# Patient Record
Sex: Male | Born: 1980
Health system: Southern US, Community
[De-identification: ages and names within clinical notes are randomized; demographics above are authoritative.]

## PROBLEM LIST (undated history)

## (undated) DIAGNOSIS — F32A Depression, unspecified: Secondary | ICD-10-CM

## (undated) HISTORY — DX: Depression, unspecified: F32.A

---
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
## 2012-11-16 HISTORY — PX: EYE SURGERY: SHX253

## 2019-10-28 DIAGNOSIS — M545 Low back pain: Secondary | ICD-10-CM | POA: Diagnosis not present

## 2019-10-28 DIAGNOSIS — M9903 Segmental and somatic dysfunction of lumbar region: Secondary | ICD-10-CM | POA: Diagnosis not present

## 2019-10-28 DIAGNOSIS — M9905 Segmental and somatic dysfunction of pelvic region: Secondary | ICD-10-CM | POA: Diagnosis not present

## 2019-10-28 DIAGNOSIS — M9901 Segmental and somatic dysfunction of cervical region: Secondary | ICD-10-CM | POA: Diagnosis not present

## 2019-10-28 DIAGNOSIS — M9902 Segmental and somatic dysfunction of thoracic region: Secondary | ICD-10-CM | POA: Diagnosis not present

## 2019-10-28 DIAGNOSIS — M7521 Bicipital tendinitis, right shoulder: Secondary | ICD-10-CM | POA: Diagnosis not present

## 2019-10-28 DIAGNOSIS — M542 Cervicalgia: Secondary | ICD-10-CM | POA: Diagnosis not present

## 2019-10-28 DIAGNOSIS — M7912 Myalgia of auxiliary muscles, head and neck: Secondary | ICD-10-CM | POA: Diagnosis not present

## 2019-10-28 DIAGNOSIS — M7918 Myalgia, other site: Secondary | ICD-10-CM | POA: Diagnosis not present

## 2019-12-25 DIAGNOSIS — M545 Low back pain: Secondary | ICD-10-CM | POA: Diagnosis not present

## 2019-12-25 DIAGNOSIS — M9903 Segmental and somatic dysfunction of lumbar region: Secondary | ICD-10-CM | POA: Diagnosis not present

## 2019-12-25 DIAGNOSIS — M9901 Segmental and somatic dysfunction of cervical region: Secondary | ICD-10-CM | POA: Diagnosis not present

## 2019-12-25 DIAGNOSIS — M7918 Myalgia, other site: Secondary | ICD-10-CM | POA: Diagnosis not present

## 2019-12-25 DIAGNOSIS — M9905 Segmental and somatic dysfunction of pelvic region: Secondary | ICD-10-CM | POA: Diagnosis not present

## 2019-12-25 DIAGNOSIS — M542 Cervicalgia: Secondary | ICD-10-CM | POA: Diagnosis not present

## 2019-12-25 DIAGNOSIS — M9902 Segmental and somatic dysfunction of thoracic region: Secondary | ICD-10-CM | POA: Diagnosis not present

## 2019-12-25 DIAGNOSIS — M7521 Bicipital tendinitis, right shoulder: Secondary | ICD-10-CM | POA: Diagnosis not present

## 2019-12-25 DIAGNOSIS — M7912 Myalgia of auxiliary muscles, head and neck: Secondary | ICD-10-CM | POA: Diagnosis not present

## 2020-01-18 DIAGNOSIS — N5201 Erectile dysfunction due to arterial insufficiency: Secondary | ICD-10-CM | POA: Diagnosis not present

## 2020-01-23 DIAGNOSIS — N5201 Erectile dysfunction due to arterial insufficiency: Secondary | ICD-10-CM | POA: Diagnosis not present

## 2020-02-09 DIAGNOSIS — Z01419 Encounter for gynecological examination (general) (routine) without abnormal findings: Secondary | ICD-10-CM | POA: Diagnosis not present

## 2020-02-09 DIAGNOSIS — Z124 Encounter for screening for malignant neoplasm of cervix: Secondary | ICD-10-CM | POA: Diagnosis not present

## 2020-02-09 DIAGNOSIS — D500 Iron deficiency anemia secondary to blood loss (chronic): Secondary | ICD-10-CM | POA: Diagnosis not present

## 2020-02-09 DIAGNOSIS — N920 Excessive and frequent menstruation with regular cycle: Secondary | ICD-10-CM | POA: Diagnosis not present

## 2020-03-11 ENCOUNTER — Other Ambulatory Visit: Payer: Self-pay

## 2020-03-11 ENCOUNTER — Ambulatory Visit: Payer: Self-pay

## 2020-03-11 ENCOUNTER — Other Ambulatory Visit: Payer: Self-pay | Admitting: Occupational Medicine

## 2020-03-11 DIAGNOSIS — M25512 Pain in left shoulder: Secondary | ICD-10-CM

## 2020-03-27 DIAGNOSIS — M9905 Segmental and somatic dysfunction of pelvic region: Secondary | ICD-10-CM | POA: Diagnosis not present

## 2020-03-27 DIAGNOSIS — M9901 Segmental and somatic dysfunction of cervical region: Secondary | ICD-10-CM | POA: Diagnosis not present

## 2020-03-27 DIAGNOSIS — M542 Cervicalgia: Secondary | ICD-10-CM | POA: Diagnosis not present

## 2020-03-27 DIAGNOSIS — M9903 Segmental and somatic dysfunction of lumbar region: Secondary | ICD-10-CM | POA: Diagnosis not present

## 2020-03-27 DIAGNOSIS — M7912 Myalgia of auxiliary muscles, head and neck: Secondary | ICD-10-CM | POA: Diagnosis not present

## 2020-03-27 DIAGNOSIS — M9902 Segmental and somatic dysfunction of thoracic region: Secondary | ICD-10-CM | POA: Diagnosis not present

## 2020-03-27 DIAGNOSIS — M545 Low back pain: Secondary | ICD-10-CM | POA: Diagnosis not present

## 2020-03-27 DIAGNOSIS — M7918 Myalgia, other site: Secondary | ICD-10-CM | POA: Diagnosis not present

## 2020-03-27 DIAGNOSIS — M7522 Bicipital tendinitis, left shoulder: Secondary | ICD-10-CM | POA: Diagnosis not present

## 2020-04-19 MED FILL — MELOXICAM 15 MG TABLET: 15 | 30 days supply | Qty: 30 | Fill #0

## 2020-04-22 ENCOUNTER — Other Ambulatory Visit: Payer: Self-pay | Admitting: Orthopedic Surgery

## 2020-04-22 ENCOUNTER — Other Ambulatory Visit (HOSPITAL_COMMUNITY): Payer: Self-pay | Admitting: Orthopedic Surgery

## 2020-04-24 ENCOUNTER — Other Ambulatory Visit: Payer: Self-pay | Admitting: Orthopedic Surgery

## 2020-04-24 ENCOUNTER — Other Ambulatory Visit (HOSPITAL_COMMUNITY): Payer: Self-pay | Admitting: Orthopedic Surgery

## 2020-04-24 DIAGNOSIS — S43432A Superior glenoid labrum lesion of left shoulder, initial encounter: Secondary | ICD-10-CM

## 2020-05-09 ENCOUNTER — Other Ambulatory Visit (HOSPITAL_COMMUNITY): Payer: Self-pay

## 2020-05-09 ENCOUNTER — Ambulatory Visit (HOSPITAL_COMMUNITY)
Admission: RE | Admit: 2020-05-09 | Discharge: 2020-05-09 | Disposition: A | Discharge status: Home / Self Care | Payer: PRIVATE HEALTH INSURANCE | Source: Ambulatory Visit | Attending: Orthopedic Surgery | Admitting: Orthopedic Surgery

## 2020-05-09 ENCOUNTER — Other Ambulatory Visit: Payer: Self-pay

## 2020-05-09 DIAGNOSIS — S43432A Superior glenoid labrum lesion of left shoulder, initial encounter: Secondary | ICD-10-CM

## 2020-05-09 MED ORDER — LIDOCAINE HCL 1 % IJ SOLN
INTRAMUSCULAR | Status: AC
  Administered 2020-05-09: 10 mL via INTRA_ARTICULAR
  Filled 2020-05-09: qty 20

## 2020-05-09 MED ORDER — GADOBUTROL 1 MMOL/ML IV SOLN
2.5000 mL | Freq: Once | INTRAVENOUS | Status: AC | PRN
  Administered 2020-05-09: 2.5 mL via INTRAVENOUS

## 2020-05-09 MED ORDER — SODIUM CHLORIDE (PF) 0.9 % IJ SOLN
INTRAMUSCULAR | Status: AC
  Administered 2020-05-09: 10 mL via INTRA_ARTICULAR
  Filled 2020-05-09: qty 10

## 2020-06-19 MED FILL — NAPROXEN 500 MG TABS: 500 | 30 days supply | Qty: 60 | Fill #0

## 2020-08-21 ENCOUNTER — Other Ambulatory Visit: Payer: Self-pay | Admitting: *Deleted

## 2020-08-21 ENCOUNTER — Encounter: Payer: Self-pay | Admitting: *Deleted

## 2020-08-23 ENCOUNTER — Encounter: Payer: Self-pay | Admitting: Family Medicine

## 2020-08-23 ENCOUNTER — Other Ambulatory Visit: Payer: Self-pay

## 2020-08-23 ENCOUNTER — Ambulatory Visit (INDEPENDENT_AMBULATORY_CARE_PROVIDER_SITE_OTHER): Payer: 59 | Admitting: Family Medicine

## 2020-08-23 VITALS — BP 133/85 | HR 44 | Temp 97.9°F | Ht 72.0 in | Wt 191.8 lb

## 2020-08-23 DIAGNOSIS — Z0000 Encounter for general adult medical examination without abnormal findings: Secondary | ICD-10-CM | POA: Diagnosis not present

## 2020-08-23 NOTE — Patient Instructions (Signed)
It was very nice to see you today!  Keep up the good work!  I will see you back in a year or so for your next physical.  Come back to see me sooner if needed.  Take care, Dr Jerline Pain  Please try these tips to maintain a healthy lifestyle:   Eat at least 3 REAL meals and 1-2 snacks per day.  Aim for no more than 5 hours between eating.  If you eat breakfast, please do so within one hour of getting up.    Each meal should contain half fruits/vegetables, one quarter protein, and one quarter carbs (no bigger than a computer mouse)   Cut down on sweet beverages. This includes juice, soda, and sweet tea.     Drink at least 1 glass of water with each meal and aim for at least 8 glasses per day   Exercise at least 150 minutes every week.    Preventive Care 43-79 Years Old, Male Preventive care refers to lifestyle choices and visits with your health care provider that can promote health and wellness. This includes:  A yearly physical exam. This is also called an annual well check.  Regular dental and eye exams.  Immunizations.  Screening for certain conditions.  Healthy lifestyle choices, such as eating a healthy diet, getting regular exercise, not using drugs or products that contain nicotine and tobacco, and limiting alcohol use. What can I expect for my preventive care visit? Physical exam Your health care provider will check:  Height and weight. These may be used to calculate body mass index (BMI), which is a measurement that tells if you are at a healthy weight.  Heart rate and blood pressure.  Your skin for abnormal spots. Counseling Your health care provider may ask you questions about:  Alcohol, tobacco, and drug use.  Emotional well-being.  Home and relationship well-being.  Sexual activity.  Eating habits.  Work and work Statistician. What immunizations do I need?  Influenza (flu) vaccine  This is recommended every year. Tetanus, diphtheria, and  pertussis (Tdap) vaccine  You may need a Td booster every 10 years. Varicella (chickenpox) vaccine  You may need this vaccine if you have not already been vaccinated. Human papillomavirus (HPV) vaccine  If recommended by your health care provider, you may need three doses over 6 months. Measles, mumps, and rubella (MMR) vaccine  You may need at least one dose of MMR. You may also need a second dose. Meningococcal conjugate (MenACWY) vaccine  One dose is recommended if you are 57-62 years old and a Market researcher living in a residence hall, or if you have one of several medical conditions. You may also need additional booster doses. Pneumococcal conjugate (PCV13) vaccine  You may need this if you have certain conditions and were not previously vaccinated. Pneumococcal polysaccharide (PPSV23) vaccine  You may need one or two doses if you smoke cigarettes or if you have certain conditions. Hepatitis A vaccine  You may need this if you have certain conditions or if you travel or work in places where you may be exposed to hepatitis A. Hepatitis B vaccine  You may need this if you have certain conditions or if you travel or work in places where you may be exposed to hepatitis B. Haemophilus influenzae type b (Hib) vaccine  You may need this if you have certain risk factors. You may receive vaccines as individual doses or as more than one vaccine together in one shot (combination vaccines). Talk  with your health care provider about the risks and benefits of combination vaccines. What tests do I need? Blood tests  Lipid and cholesterol levels. These may be checked every 5 years starting at age 12.  Hepatitis C test.  Hepatitis B test. Screening   Diabetes screening. This is done by checking your blood sugar (glucose) after you have not eaten for a while (fasting).  Sexually transmitted disease (STD) testing. Talk with your health care provider about your test results,  treatment options, and if necessary, the need for more tests. Follow these instructions at home: Eating and drinking   Eat a diet that includes fresh fruits and vegetables, whole grains, lean protein, and low-fat dairy products.  Take vitamin and mineral supplements as recommended by your health care provider.  Do not drink alcohol if your health care provider tells you not to drink.  If you drink alcohol: ? Limit how much you have to 0-2 drinks a day. ? Be aware of how much alcohol is in your drink. In the [place] one drink equals one 12 oz bottle of beer (355 mL), one 5 oz glass of wine (148 mL), or one 1 oz glass of hard liquor (44 mL). Lifestyle  Take daily care of your teeth and gums.  Stay active. Exercise for at least 30 minutes on 5 or more days each week.  Do not use any products that contain nicotine or tobacco, such as cigarettes, e-cigarettes, and chewing tobacco. If you need help quitting, ask your health care provider.  If you are sexually active, practice safe sex. Use a condom or other form of protection to prevent STIs (sexually transmitted infections). What's next?  Go to your health care provider once a year for a well check visit.  Ask your health care provider how often you should have your eyes and teeth checked.  Stay up to date on all vaccines. This information is not intended to replace advice given to you by your health care provider. Make sure you discuss any questions you have with your health care provider. Document Revised: 10/27/2018 Document Reviewed: 10/27/2018 Elsevier Patient Education  2020 [employer].

## 2020-08-23 NOTE — Progress Notes (Signed)
Chief Complaint:  Corleone Biegler is a 39 y.o. male who presents today for his annual comprehensive physical exam.  HE is a new patient.   Assessment/Plan:  Healthy 39 year old man doing well.  Preventative Healthcare: UTD on vaccines.   Patient Counseling(The following topics were reviewed and/or handout was given):  -Nutrition: Stressed importance of moderation in sodium/caffeine intake, saturated fat and cholesterol, caloric balance, sufficient intake of fresh fruits, vegetables, and fiber.  -Stressed the importance of regular exercise.   -Substance Abuse: Discussed cessation/primary prevention of tobacco, alcohol, or other drug use; driving or other dangerous activities under the influence; availability of treatment for abuse.   -Injury prevention: Discussed safety belts, safety helmets, smoke detector, smoking near bedding or upholstery.   -Sexuality: Discussed sexually transmitted diseases, partner selection, use of condoms, avoidance of unintended pregnancy and contraceptive alternatives.   -Dental health: Discussed importance of regular tooth brushing, flossing, and dental visits.  -Health maintenance and immunizations reviewed. Please refer to Health maintenance section.  Return to care in 1 year for next preventative visit.     Subjective:  HPI:  He has no acute complaints today.   Lifestyle Diet: Balanced. Plenty of fruits and vegetables.  Exercise: Limited due to current orthopedics issues.   No flowsheet data found.  Health Maintenance Due  Topic Date Due  . Hepatitis C Screening  Never done  . HIV Screening  Never done  . TETANUS/TDAP  Never done    ROS: A complete review of systems was negative.   PMH:  The following were reviewed and entered/updated in epic: Past Medical History:  Diagnosis Date  . Depression    There are no problems to display for this patient.  Past Surgical History:  Procedure Laterality Date  . EYE SURGERY Bilateral 2014  .  TONSILLECTOMY  1999    Family History  Problem Relation Age of Onset  . Arthritis Mother   . High blood pressure Mother   . Depression Father   . Hearing loss Father   . Arthritis Maternal Grandmother   . Cancer Maternal Grandfather   . Hearing loss Paternal Grandmother   . Stroke Paternal Grandmother   . Depression Paternal Grandfather     Medications- reviewed and updated No current outpatient medications on file.   No current facility-administered medications for this visit.    Allergies-reviewed and updated No Known Allergies  Social History   Socioeconomic History  . Marital status: Married    Spouse name: Not on file  . Number of children: Not on file  . Years of education: Not on file  . Highest education level: Not on file  Occupational History  . Not on file  Tobacco Use  . Smoking status: Former Smoker  Substance and Sexual Activity  . Alcohol use: Yes  . Drug use: Not Currently  . Sexual activity: Yes    Partners: Female  Other Topics Concern  . Not on file  Social History Narrative  . Not on file   Social Determinants of Health   Financial Resource Strain:   . Difficulty of Paying Living Expenses: Not on file  Food Insecurity:   . Worried About Programme researcher, broadcasting/film/video in the Last Year: Not on file  . Ran Out of Food in the Last Year: Not on file  Transportation Needs:   . Lack of Transportation (Medical): Not on file  . Lack of Transportation (Non-Medical): Not on file  Physical Activity:   . Days of  Exercise per Week: Not on file  . Minutes of Exercise per Session: Not on file  Stress:   . Feeling of Stress : Not on file  Social Connections:   . Frequency of Communication with Friends and Family: Not on file  . Frequency of Social Gatherings with Friends and Family: Not on file  . Attends Religious Services: Not on file  . Active Member of Clubs or Organizations: Not on file  . Attends Banker Meetings: Not on file  . Marital  Status: Not on file        Objective:  Physical Exam: BP 133/85   Pulse (!) 44   Temp 97.9 F (36.6 C) (Temporal)   Ht 6' (1.829 m)   Wt 191 lb 12.8 oz (87 kg)   SpO2 99%   BMI 26.01 kg/m   Body mass index is 26.01 kg/m. Wt Readings from Last 3 Encounters:  08/23/20 191 lb 12.8 oz (87 kg)   Gen: NAD, resting comfortably HEENT: TMs normal bilaterally. OP clear. No thyromegaly noted.  CV: RRR with no murmurs appreciated Pulm: NWOB, CTAB with no crackles, wheezes, or rhonchi GI: Normal bowel sounds present. Soft, Nontender, Nondistended. MSK: no edema, cyanosis, or clubbing noted Skin: warm, dry Neuro: CN2-12 grossly intact. Strength 5/5 in upper and lower extremities. Reflexes symmetric and intact bilaterally.  Psych: Normal affect and thought content     Janaisa Birkland M. Jimmey Ralph, MD 08/23/2020 9:19 AM

## 2020-11-28 DIAGNOSIS — M542 Cervicalgia: Secondary | ICD-10-CM | POA: Diagnosis not present

## 2020-11-28 DIAGNOSIS — M9905 Segmental and somatic dysfunction of pelvic region: Secondary | ICD-10-CM | POA: Diagnosis not present

## 2020-11-28 DIAGNOSIS — M9902 Segmental and somatic dysfunction of thoracic region: Secondary | ICD-10-CM | POA: Diagnosis not present

## 2020-11-28 DIAGNOSIS — M7912 Myalgia of auxiliary muscles, head and neck: Secondary | ICD-10-CM | POA: Diagnosis not present

## 2020-11-28 DIAGNOSIS — M546 Pain in thoracic spine: Secondary | ICD-10-CM | POA: Diagnosis not present

## 2020-11-28 DIAGNOSIS — M7918 Myalgia, other site: Secondary | ICD-10-CM | POA: Diagnosis not present

## 2020-11-28 DIAGNOSIS — M545 Low back pain, unspecified: Secondary | ICD-10-CM | POA: Diagnosis not present

## 2020-11-28 DIAGNOSIS — M9901 Segmental and somatic dysfunction of cervical region: Secondary | ICD-10-CM | POA: Diagnosis not present

## 2020-11-28 DIAGNOSIS — M9903 Segmental and somatic dysfunction of lumbar region: Secondary | ICD-10-CM | POA: Diagnosis not present

## 2021-01-01 DIAGNOSIS — M9902 Segmental and somatic dysfunction of thoracic region: Secondary | ICD-10-CM | POA: Diagnosis not present

## 2021-01-01 DIAGNOSIS — M7918 Myalgia, other site: Secondary | ICD-10-CM | POA: Diagnosis not present

## 2021-01-01 DIAGNOSIS — M7912 Myalgia of auxiliary muscles, head and neck: Secondary | ICD-10-CM | POA: Diagnosis not present

## 2021-01-01 DIAGNOSIS — M9905 Segmental and somatic dysfunction of pelvic region: Secondary | ICD-10-CM | POA: Diagnosis not present

## 2021-01-01 DIAGNOSIS — M542 Cervicalgia: Secondary | ICD-10-CM | POA: Diagnosis not present

## 2021-01-01 DIAGNOSIS — M545 Low back pain, unspecified: Secondary | ICD-10-CM | POA: Diagnosis not present

## 2021-01-01 DIAGNOSIS — M9901 Segmental and somatic dysfunction of cervical region: Secondary | ICD-10-CM | POA: Diagnosis not present

## 2021-01-01 DIAGNOSIS — M9903 Segmental and somatic dysfunction of lumbar region: Secondary | ICD-10-CM | POA: Diagnosis not present

## 2021-01-01 DIAGNOSIS — M546 Pain in thoracic spine: Secondary | ICD-10-CM | POA: Diagnosis not present
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
## 2021-03-08 IMAGING — RF DG FLUORO GUIDE NDL PLC/BX
3 series · 3 of 3 positions shown · IV contrast (gadavist)
Comparison: none

CLINICAL DATA: MRI injection

EXAM:
LEFT SHOULDER INJECTION UNDER FLUOROSCOPY
TECHNIQUE: An appropriate skin entrance site was determined. The site was
marked, prepped with Betadine, draped in the usual sterile fashion,
and infiltrated locally with buffered Lidocaine. 22 gauge spinal
needle was advanced to the superomedial margin of the humeral head
under intermittent fluoroscopy. 1 ml of Lidocaine injected easily. A
mixture of 0.05 ml Gadavist 10 ml of Omnipaque 300 was then used to
opacify the left shoulder capsule. No immediate complication.
FLUOROSCOPY TIME:  Fluoroscopy Time:  24 seconds
Radiation Exposure Index (if provided by the fluoroscopic device): 2
mGy

[Series 1: cp_standard · 0.18mm/px · 1 of 1 slices shown (1 of 3)]
[im 1/1]
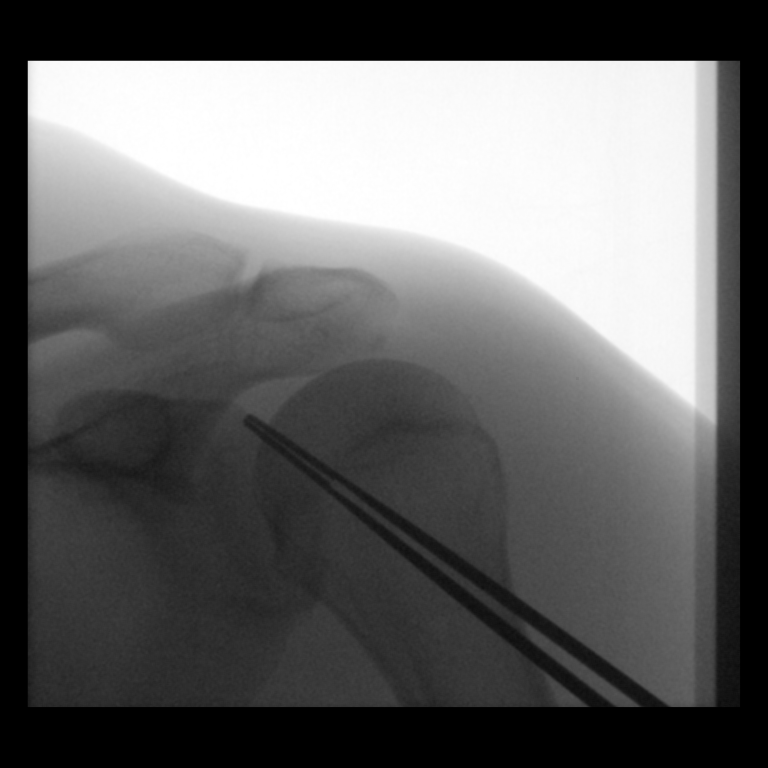

[Series 2: cp_standard · 0.18mm/px · 1 of 1 slices shown (2 of 3)]
[im 1/1]
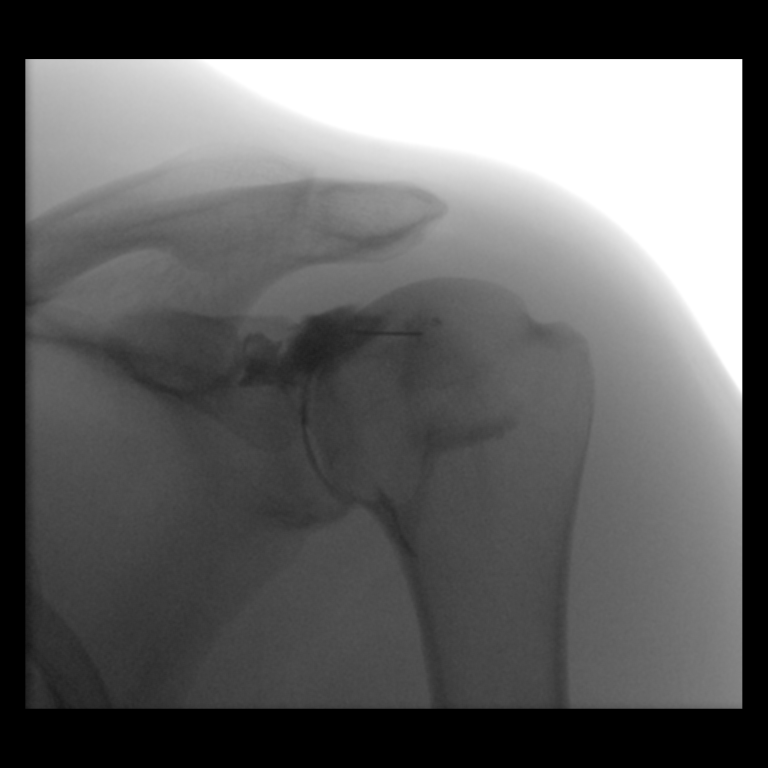

[Series 3: cp_standard · 0.18mm/px · 1 of 1 slices shown (3 of 3)]
[im 1/1]
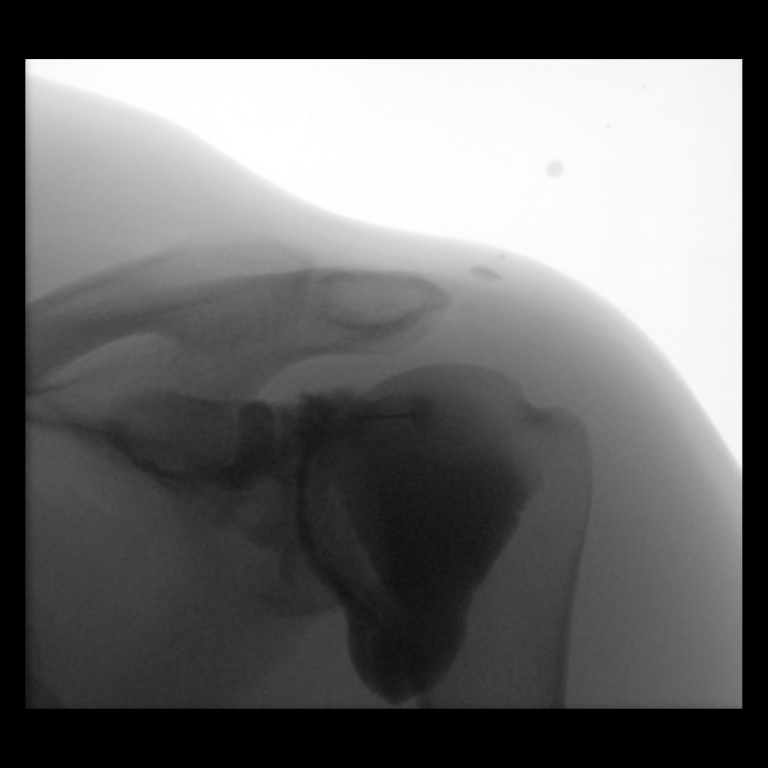

[3 of 3 positions shown; findings below may reference images not displayed]

FINDINGS: Intra-articular contrast is identified upon injection. There is
significant osseous abnormality.
IMPRESSION: Technically successful left shoulder injection for MRI.

## 2021-04-09 ENCOUNTER — Telehealth: Payer: Self-pay

## 2021-04-09 NOTE — Telephone Encounter (Signed)
CODE:[phone] // Fax number is [phone]

## 2021-04-09 NOTE — Telephone Encounter (Signed)
Jerry Wilson is need a TB lab done for school purposes, wondering if we can fax a request to [facility] on [facility].

## 2021-04-09 NOTE — Telephone Encounter (Signed)
Please advise 

## 2021-04-11 ENCOUNTER — Other Ambulatory Visit: Payer: Self-pay | Admitting: *Deleted

## 2021-04-11 DIAGNOSIS — Z111 Encounter for screening for respiratory tuberculosis: Secondary | ICD-10-CM

## 2021-04-11 NOTE — Telephone Encounter (Signed)
Ok with me. Please place any necessary orders. 

## 2021-04-11 NOTE — Telephone Encounter (Signed)
Pt is calling about this order. Pt is there now and is asking for [place] to send the order over now.

## 2021-04-11 NOTE — Telephone Encounter (Signed)
Order done and faxed over to [facility] by [facility].

## 2021-04-17 ENCOUNTER — Other Ambulatory Visit: Payer: Self-pay | Admitting: Family Medicine

## 2021-04-17 DIAGNOSIS — Z111 Encounter for screening for respiratory tuberculosis: Secondary | ICD-10-CM | POA: Diagnosis not present

## 2021-04-20 LAB — QUANTIFERON-TB GOLD PLUS
QuantiFERON Mitogen Value: >10 IU/mL
QuantiFERON Nil Value: 0.06 IU/mL
QuantiFERON TB1 Ag Value: 0.05 IU/mL
QuantiFERON TB2 Ag Value: 0.03 IU/mL
QuantiFERON-TB Gold Plus: NEGATIVE

## 2021-04-21 NOTE — Progress Notes (Signed)
Please inform patient of the following:  His TB test is negative.  Katina Degree. Jimmey Ralph, MD 04/21/2021 2:16 PM

## 2021-06-24 DIAGNOSIS — M546 Pain in thoracic spine: Secondary | ICD-10-CM | POA: Diagnosis not present

## 2021-06-24 DIAGNOSIS — M9903 Segmental and somatic dysfunction of lumbar region: Secondary | ICD-10-CM | POA: Diagnosis not present

## 2021-06-24 DIAGNOSIS — M7912 Myalgia of auxiliary muscles, head and neck: Secondary | ICD-10-CM | POA: Diagnosis not present

## 2021-06-24 DIAGNOSIS — M545 Low back pain, unspecified: Secondary | ICD-10-CM | POA: Diagnosis not present

## 2021-06-24 DIAGNOSIS — M9901 Segmental and somatic dysfunction of cervical region: Secondary | ICD-10-CM | POA: Diagnosis not present

## 2021-06-24 DIAGNOSIS — M542 Cervicalgia: Secondary | ICD-10-CM | POA: Diagnosis not present

## 2021-06-24 DIAGNOSIS — M9905 Segmental and somatic dysfunction of pelvic region: Secondary | ICD-10-CM | POA: Diagnosis not present

## 2021-06-24 DIAGNOSIS — M9902 Segmental and somatic dysfunction of thoracic region: Secondary | ICD-10-CM | POA: Diagnosis not present

## 2021-08-18 DIAGNOSIS — M542 Cervicalgia: Secondary | ICD-10-CM | POA: Diagnosis not present

## 2021-08-18 DIAGNOSIS — M546 Pain in thoracic spine: Secondary | ICD-10-CM | POA: Diagnosis not present

## 2021-08-18 DIAGNOSIS — M9902 Segmental and somatic dysfunction of thoracic region: Secondary | ICD-10-CM | POA: Diagnosis not present

## 2021-08-18 DIAGNOSIS — M545 Low back pain, unspecified: Secondary | ICD-10-CM | POA: Diagnosis not present

## 2021-08-18 DIAGNOSIS — M9905 Segmental and somatic dysfunction of pelvic region: Secondary | ICD-10-CM | POA: Diagnosis not present

## 2021-08-18 DIAGNOSIS — M9901 Segmental and somatic dysfunction of cervical region: Secondary | ICD-10-CM | POA: Diagnosis not present

## 2021-08-18 DIAGNOSIS — M9903 Segmental and somatic dysfunction of lumbar region: Secondary | ICD-10-CM | POA: Diagnosis not present

## 2021-08-18 DIAGNOSIS — M7912 Myalgia of auxiliary muscles, head and neck: Secondary | ICD-10-CM | POA: Diagnosis not present

## 2021-09-16 DIAGNOSIS — M9901 Segmental and somatic dysfunction of cervical region: Secondary | ICD-10-CM | POA: Diagnosis not present

## 2021-09-16 DIAGNOSIS — M9902 Segmental and somatic dysfunction of thoracic region: Secondary | ICD-10-CM | POA: Diagnosis not present

## 2021-09-16 DIAGNOSIS — M545 Low back pain, unspecified: Secondary | ICD-10-CM | POA: Diagnosis not present

## 2021-09-16 DIAGNOSIS — M7912 Myalgia of auxiliary muscles, head and neck: Secondary | ICD-10-CM | POA: Diagnosis not present

## 2021-09-16 DIAGNOSIS — M9905 Segmental and somatic dysfunction of pelvic region: Secondary | ICD-10-CM | POA: Diagnosis not present

## 2021-09-16 DIAGNOSIS — M546 Pain in thoracic spine: Secondary | ICD-10-CM | POA: Diagnosis not present

## 2021-09-16 DIAGNOSIS — M9903 Segmental and somatic dysfunction of lumbar region: Secondary | ICD-10-CM | POA: Diagnosis not present

## 2021-09-16 DIAGNOSIS — M542 Cervicalgia: Secondary | ICD-10-CM | POA: Diagnosis not present

## 2021-11-03 DIAGNOSIS — M9903 Segmental and somatic dysfunction of lumbar region: Secondary | ICD-10-CM | POA: Diagnosis not present

## 2021-11-03 DIAGNOSIS — M546 Pain in thoracic spine: Secondary | ICD-10-CM | POA: Diagnosis not present

## 2021-11-03 DIAGNOSIS — M9902 Segmental and somatic dysfunction of thoracic region: Secondary | ICD-10-CM | POA: Diagnosis not present

## 2021-11-03 DIAGNOSIS — M7912 Myalgia of auxiliary muscles, head and neck: Secondary | ICD-10-CM | POA: Diagnosis not present

## 2021-11-03 DIAGNOSIS — M545 Low back pain, unspecified: Secondary | ICD-10-CM | POA: Diagnosis not present

## 2021-11-03 DIAGNOSIS — M9905 Segmental and somatic dysfunction of pelvic region: Secondary | ICD-10-CM | POA: Diagnosis not present

## 2021-11-03 DIAGNOSIS — M542 Cervicalgia: Secondary | ICD-10-CM | POA: Diagnosis not present

## 2021-11-03 DIAGNOSIS — M9901 Segmental and somatic dysfunction of cervical region: Secondary | ICD-10-CM | POA: Diagnosis not present

## 2021-11-19 DIAGNOSIS — M9905 Segmental and somatic dysfunction of pelvic region: Secondary | ICD-10-CM | POA: Diagnosis not present

## 2021-11-19 DIAGNOSIS — M542 Cervicalgia: Secondary | ICD-10-CM | POA: Diagnosis not present

## 2021-11-19 DIAGNOSIS — M9902 Segmental and somatic dysfunction of thoracic region: Secondary | ICD-10-CM | POA: Diagnosis not present

## 2021-11-19 DIAGNOSIS — M9903 Segmental and somatic dysfunction of lumbar region: Secondary | ICD-10-CM | POA: Diagnosis not present

## 2021-11-19 DIAGNOSIS — M7912 Myalgia of auxiliary muscles, head and neck: Secondary | ICD-10-CM | POA: Diagnosis not present

## 2021-11-19 DIAGNOSIS — M546 Pain in thoracic spine: Secondary | ICD-10-CM | POA: Diagnosis not present

## 2021-11-19 DIAGNOSIS — M545 Low back pain, unspecified: Secondary | ICD-10-CM | POA: Diagnosis not present

## 2021-11-19 DIAGNOSIS — M9901 Segmental and somatic dysfunction of cervical region: Secondary | ICD-10-CM | POA: Diagnosis not present

## 2022-01-19 DIAGNOSIS — B309 Viral conjunctivitis, unspecified: Secondary | ICD-10-CM | POA: Diagnosis not present

## 2022-02-19 DIAGNOSIS — M545 Low back pain, unspecified: Secondary | ICD-10-CM | POA: Diagnosis not present

## 2022-02-19 DIAGNOSIS — M9903 Segmental and somatic dysfunction of lumbar region: Secondary | ICD-10-CM | POA: Diagnosis not present

## 2022-02-19 DIAGNOSIS — M25652 Stiffness of left hip, not elsewhere classified: Secondary | ICD-10-CM | POA: Diagnosis not present

## 2022-02-19 DIAGNOSIS — M9901 Segmental and somatic dysfunction of cervical region: Secondary | ICD-10-CM | POA: Diagnosis not present

## 2022-02-19 DIAGNOSIS — M7912 Myalgia of auxiliary muscles, head and neck: Secondary | ICD-10-CM | POA: Diagnosis not present

## 2022-02-19 DIAGNOSIS — M25651 Stiffness of right hip, not elsewhere classified: Secondary | ICD-10-CM | POA: Diagnosis not present

## 2022-02-19 DIAGNOSIS — M9905 Segmental and somatic dysfunction of pelvic region: Secondary | ICD-10-CM | POA: Diagnosis not present

## 2022-02-19 DIAGNOSIS — S39012A Strain of muscle, fascia and tendon of lower back, initial encounter: Secondary | ICD-10-CM | POA: Diagnosis not present

## 2022-02-19 DIAGNOSIS — M9902 Segmental and somatic dysfunction of thoracic region: Secondary | ICD-10-CM | POA: Diagnosis not present

## 2022-03-04 DIAGNOSIS — M7912 Myalgia of auxiliary muscles, head and neck: Secondary | ICD-10-CM | POA: Diagnosis not present

## 2022-03-04 DIAGNOSIS — M545 Low back pain, unspecified: Secondary | ICD-10-CM | POA: Diagnosis not present

## 2022-03-04 DIAGNOSIS — M9902 Segmental and somatic dysfunction of thoracic region: Secondary | ICD-10-CM | POA: Diagnosis not present

## 2022-03-04 DIAGNOSIS — M9901 Segmental and somatic dysfunction of cervical region: Secondary | ICD-10-CM | POA: Diagnosis not present

## 2022-03-04 DIAGNOSIS — M9905 Segmental and somatic dysfunction of pelvic region: Secondary | ICD-10-CM | POA: Diagnosis not present

## 2022-03-04 DIAGNOSIS — S39012A Strain of muscle, fascia and tendon of lower back, initial encounter: Secondary | ICD-10-CM | POA: Diagnosis not present

## 2022-03-04 DIAGNOSIS — M25652 Stiffness of left hip, not elsewhere classified: Secondary | ICD-10-CM | POA: Diagnosis not present

## 2022-03-04 DIAGNOSIS — M25651 Stiffness of right hip, not elsewhere classified: Secondary | ICD-10-CM | POA: Diagnosis not present

## 2022-03-04 DIAGNOSIS — M9903 Segmental and somatic dysfunction of lumbar region: Secondary | ICD-10-CM | POA: Diagnosis not present

## 2022-03-30 DIAGNOSIS — M25512 Pain in left shoulder: Secondary | ICD-10-CM | POA: Diagnosis not present

## 2022-04-28 ENCOUNTER — Other Ambulatory Visit (HOSPITAL_COMMUNITY): Payer: Self-pay

## 2022-04-28 DIAGNOSIS — M7542 Impingement syndrome of left shoulder: Secondary | ICD-10-CM | POA: Diagnosis not present

## 2022-04-28 DIAGNOSIS — M7552 Bursitis of left shoulder: Secondary | ICD-10-CM | POA: Diagnosis not present

## 2022-04-28 DIAGNOSIS — M7522 Bicipital tendinitis, left shoulder: Secondary | ICD-10-CM | POA: Diagnosis not present

## 2022-04-28 DIAGNOSIS — S43492A Other sprain of left shoulder joint, initial encounter: Secondary | ICD-10-CM | POA: Diagnosis not present

## 2022-04-28 DIAGNOSIS — G8918 Other acute postprocedural pain: Secondary | ICD-10-CM | POA: Diagnosis not present

## 2022-04-28 DIAGNOSIS — S43432A Superior glenoid labrum lesion of left shoulder, initial encounter: Secondary | ICD-10-CM | POA: Diagnosis not present

## 2022-04-28 DIAGNOSIS — M67814 Other specified disorders of tendon, left shoulder: Secondary | ICD-10-CM | POA: Diagnosis not present

## 2022-04-28 MED ORDER — METHOCARBAMOL 500 MG PO TABS
ORAL_TABLET | ORAL | 0 refills | Status: DC
  Filled 2022-04-28: qty 20, 7d supply, fill #0

## 2022-04-28 MED ORDER — OXYCODONE HCL 5 MG PO TABS
ORAL_TABLET | ORAL | 0 refills | Status: AC
  Filled 2022-04-28: qty 20, 5d supply, fill #0

## 2022-05-08 ENCOUNTER — Other Ambulatory Visit (HOSPITAL_COMMUNITY): Payer: Self-pay

## 2022-05-08 DIAGNOSIS — Z9889 Other specified postprocedural states: Secondary | ICD-10-CM | POA: Diagnosis not present

## 2022-05-08 MED ORDER — METHOCARBAMOL 500 MG PO TABS
ORAL_TABLET | ORAL | 0 refills | Status: AC
  Filled 2022-05-08: qty 20, 7d supply, fill #0

## 2022-05-12 DIAGNOSIS — S43432D Superior glenoid labrum lesion of left shoulder, subsequent encounter: Secondary | ICD-10-CM | POA: Diagnosis not present

## 2022-05-12 DIAGNOSIS — Z9889 Other specified postprocedural states: Secondary | ICD-10-CM | POA: Diagnosis not present

## 2022-05-12 DIAGNOSIS — M7522 Bicipital tendinitis, left shoulder: Secondary | ICD-10-CM | POA: Diagnosis not present

## 2022-05-15 DIAGNOSIS — S43432D Superior glenoid labrum lesion of left shoulder, subsequent encounter: Secondary | ICD-10-CM | POA: Diagnosis not present

## 2022-05-15 DIAGNOSIS — Z9889 Other specified postprocedural states: Secondary | ICD-10-CM | POA: Diagnosis not present

## 2022-05-15 DIAGNOSIS — M7522 Bicipital tendinitis, left shoulder: Secondary | ICD-10-CM | POA: Diagnosis not present

## 2022-05-18 DIAGNOSIS — S43432D Superior glenoid labrum lesion of left shoulder, subsequent encounter: Secondary | ICD-10-CM | POA: Diagnosis not present

## 2022-05-18 DIAGNOSIS — Z9889 Other specified postprocedural states: Secondary | ICD-10-CM | POA: Diagnosis not present

## 2022-05-18 DIAGNOSIS — M7522 Bicipital tendinitis, left shoulder: Secondary | ICD-10-CM | POA: Diagnosis not present

## 2022-05-21 DIAGNOSIS — M7522 Bicipital tendinitis, left shoulder: Secondary | ICD-10-CM | POA: Diagnosis not present

## 2022-05-21 DIAGNOSIS — S43432D Superior glenoid labrum lesion of left shoulder, subsequent encounter: Secondary | ICD-10-CM | POA: Diagnosis not present

## 2022-05-21 DIAGNOSIS — Z9889 Other specified postprocedural states: Secondary | ICD-10-CM | POA: Diagnosis not present

## 2022-05-25 DIAGNOSIS — S43432D Superior glenoid labrum lesion of left shoulder, subsequent encounter: Secondary | ICD-10-CM | POA: Diagnosis not present

## 2022-05-25 DIAGNOSIS — M7522 Bicipital tendinitis, left shoulder: Secondary | ICD-10-CM | POA: Diagnosis not present

## 2022-05-25 DIAGNOSIS — Z9889 Other specified postprocedural states: Secondary | ICD-10-CM | POA: Diagnosis not present

## 2022-05-28 DIAGNOSIS — M7522 Bicipital tendinitis, left shoulder: Secondary | ICD-10-CM | POA: Diagnosis not present

## 2022-05-28 DIAGNOSIS — S43432D Superior glenoid labrum lesion of left shoulder, subsequent encounter: Secondary | ICD-10-CM | POA: Diagnosis not present

## 2022-05-28 DIAGNOSIS — Z9889 Other specified postprocedural states: Secondary | ICD-10-CM | POA: Diagnosis not present

## 2022-06-02 DIAGNOSIS — Z9889 Other specified postprocedural states: Secondary | ICD-10-CM | POA: Diagnosis not present

## 2022-06-02 DIAGNOSIS — M7522 Bicipital tendinitis, left shoulder: Secondary | ICD-10-CM | POA: Diagnosis not present

## 2022-06-02 DIAGNOSIS — S43432D Superior glenoid labrum lesion of left shoulder, subsequent encounter: Secondary | ICD-10-CM | POA: Diagnosis not present

## 2022-06-03 DIAGNOSIS — M9902 Segmental and somatic dysfunction of thoracic region: Secondary | ICD-10-CM | POA: Diagnosis not present

## 2022-06-03 DIAGNOSIS — M25651 Stiffness of right hip, not elsewhere classified: Secondary | ICD-10-CM | POA: Diagnosis not present

## 2022-06-03 DIAGNOSIS — M545 Low back pain, unspecified: Secondary | ICD-10-CM | POA: Diagnosis not present

## 2022-06-03 DIAGNOSIS — M9903 Segmental and somatic dysfunction of lumbar region: Secondary | ICD-10-CM | POA: Diagnosis not present

## 2022-06-03 DIAGNOSIS — M9905 Segmental and somatic dysfunction of pelvic region: Secondary | ICD-10-CM | POA: Diagnosis not present

## 2022-06-03 DIAGNOSIS — M9901 Segmental and somatic dysfunction of cervical region: Secondary | ICD-10-CM | POA: Diagnosis not present

## 2022-06-03 DIAGNOSIS — M7912 Myalgia of auxiliary muscles, head and neck: Secondary | ICD-10-CM | POA: Diagnosis not present

## 2022-06-03 DIAGNOSIS — M25652 Stiffness of left hip, not elsewhere classified: Secondary | ICD-10-CM | POA: Diagnosis not present

## 2022-06-03 DIAGNOSIS — M542 Cervicalgia: Secondary | ICD-10-CM | POA: Diagnosis not present

## 2022-06-04 DIAGNOSIS — M7522 Bicipital tendinitis, left shoulder: Secondary | ICD-10-CM | POA: Diagnosis not present

## 2022-06-04 DIAGNOSIS — Z9889 Other specified postprocedural states: Secondary | ICD-10-CM | POA: Diagnosis not present

## 2022-06-04 DIAGNOSIS — S43432D Superior glenoid labrum lesion of left shoulder, subsequent encounter: Secondary | ICD-10-CM | POA: Diagnosis not present

## 2022-06-10 DIAGNOSIS — M7522 Bicipital tendinitis, left shoulder: Secondary | ICD-10-CM | POA: Diagnosis not present

## 2022-06-10 DIAGNOSIS — Z9889 Other specified postprocedural states: Secondary | ICD-10-CM | POA: Diagnosis not present

## 2022-06-10 DIAGNOSIS — S43432D Superior glenoid labrum lesion of left shoulder, subsequent encounter: Secondary | ICD-10-CM | POA: Diagnosis not present

## 2022-06-17 DIAGNOSIS — Z9889 Other specified postprocedural states: Secondary | ICD-10-CM | POA: Diagnosis not present

## 2022-06-17 DIAGNOSIS — M7522 Bicipital tendinitis, left shoulder: Secondary | ICD-10-CM | POA: Diagnosis not present

## 2022-06-17 DIAGNOSIS — S43432D Superior glenoid labrum lesion of left shoulder, subsequent encounter: Secondary | ICD-10-CM | POA: Diagnosis not present

## 2022-06-24 DIAGNOSIS — M7522 Bicipital tendinitis, left shoulder: Secondary | ICD-10-CM | POA: Diagnosis not present

## 2022-06-24 DIAGNOSIS — S43432D Superior glenoid labrum lesion of left shoulder, subsequent encounter: Secondary | ICD-10-CM | POA: Diagnosis not present

## 2022-06-24 DIAGNOSIS — Z9889 Other specified postprocedural states: Secondary | ICD-10-CM | POA: Diagnosis not present

## 2022-06-29 DIAGNOSIS — Z9889 Other specified postprocedural states: Secondary | ICD-10-CM | POA: Diagnosis not present

## 2022-06-29 DIAGNOSIS — M7522 Bicipital tendinitis, left shoulder: Secondary | ICD-10-CM | POA: Diagnosis not present

## 2022-06-29 DIAGNOSIS — S43432D Superior glenoid labrum lesion of left shoulder, subsequent encounter: Secondary | ICD-10-CM | POA: Diagnosis not present

## 2022-07-01 DIAGNOSIS — M7522 Bicipital tendinitis, left shoulder: Secondary | ICD-10-CM | POA: Diagnosis not present

## 2022-07-01 DIAGNOSIS — S43432D Superior glenoid labrum lesion of left shoulder, subsequent encounter: Secondary | ICD-10-CM | POA: Diagnosis not present

## 2022-07-01 DIAGNOSIS — Z9889 Other specified postprocedural states: Secondary | ICD-10-CM | POA: Diagnosis not present

## 2022-07-06 DIAGNOSIS — Z9889 Other specified postprocedural states: Secondary | ICD-10-CM | POA: Diagnosis not present

## 2022-07-06 DIAGNOSIS — S43432D Superior glenoid labrum lesion of left shoulder, subsequent encounter: Secondary | ICD-10-CM | POA: Diagnosis not present

## 2022-07-06 DIAGNOSIS — M7522 Bicipital tendinitis, left shoulder: Secondary | ICD-10-CM | POA: Diagnosis not present

## 2022-07-09 DIAGNOSIS — S43432D Superior glenoid labrum lesion of left shoulder, subsequent encounter: Secondary | ICD-10-CM | POA: Diagnosis not present

## 2022-07-09 DIAGNOSIS — M7522 Bicipital tendinitis, left shoulder: Secondary | ICD-10-CM | POA: Diagnosis not present

## 2022-07-09 DIAGNOSIS — Z9889 Other specified postprocedural states: Secondary | ICD-10-CM | POA: Diagnosis not present

## 2022-07-16 DIAGNOSIS — Z9889 Other specified postprocedural states: Secondary | ICD-10-CM | POA: Diagnosis not present

## 2022-07-16 DIAGNOSIS — M7522 Bicipital tendinitis, left shoulder: Secondary | ICD-10-CM | POA: Diagnosis not present

## 2022-07-16 DIAGNOSIS — S43432D Superior glenoid labrum lesion of left shoulder, subsequent encounter: Secondary | ICD-10-CM | POA: Diagnosis not present

## 2022-08-10 ENCOUNTER — Encounter: Payer: Self-pay | Admitting: *Deleted

## 2022-08-24 DIAGNOSIS — Z09 Encounter for follow-up examination after completed treatment for conditions other than malignant neoplasm: Secondary | ICD-10-CM | POA: Diagnosis not present

## 2022-09-22 DIAGNOSIS — M7522 Bicipital tendinitis, left shoulder: Secondary | ICD-10-CM | POA: Diagnosis not present

## 2022-09-22 DIAGNOSIS — Z9889 Other specified postprocedural states: Secondary | ICD-10-CM | POA: Diagnosis not present

## 2022-09-22 DIAGNOSIS — S43432D Superior glenoid labrum lesion of left shoulder, subsequent encounter: Secondary | ICD-10-CM | POA: Diagnosis not present

## 2022-10-01 DIAGNOSIS — Z9889 Other specified postprocedural states: Secondary | ICD-10-CM | POA: Diagnosis not present

## 2022-10-01 DIAGNOSIS — M7522 Bicipital tendinitis, left shoulder: Secondary | ICD-10-CM | POA: Diagnosis not present

## 2022-10-01 DIAGNOSIS — S43432D Superior glenoid labrum lesion of left shoulder, subsequent encounter: Secondary | ICD-10-CM | POA: Diagnosis not present

## 2022-10-12 DIAGNOSIS — M7522 Bicipital tendinitis, left shoulder: Secondary | ICD-10-CM | POA: Diagnosis not present

## 2022-10-12 DIAGNOSIS — Z9889 Other specified postprocedural states: Secondary | ICD-10-CM | POA: Diagnosis not present

## 2022-10-12 DIAGNOSIS — S43432D Superior glenoid labrum lesion of left shoulder, subsequent encounter: Secondary | ICD-10-CM | POA: Diagnosis not present

## 2022-10-13 DIAGNOSIS — M25652 Stiffness of left hip, not elsewhere classified: Secondary | ICD-10-CM | POA: Diagnosis not present

## 2022-10-13 DIAGNOSIS — M545 Low back pain, unspecified: Secondary | ICD-10-CM | POA: Diagnosis not present

## 2022-10-13 DIAGNOSIS — M9902 Segmental and somatic dysfunction of thoracic region: Secondary | ICD-10-CM | POA: Diagnosis not present

## 2022-10-13 DIAGNOSIS — M542 Cervicalgia: Secondary | ICD-10-CM | POA: Diagnosis not present

## 2022-10-13 DIAGNOSIS — M25651 Stiffness of right hip, not elsewhere classified: Secondary | ICD-10-CM | POA: Diagnosis not present

## 2022-10-13 DIAGNOSIS — M9905 Segmental and somatic dysfunction of pelvic region: Secondary | ICD-10-CM | POA: Diagnosis not present

## 2022-10-13 DIAGNOSIS — M7912 Myalgia of auxiliary muscles, head and neck: Secondary | ICD-10-CM | POA: Diagnosis not present

## 2022-10-13 DIAGNOSIS — M9903 Segmental and somatic dysfunction of lumbar region: Secondary | ICD-10-CM | POA: Diagnosis not present

## 2022-10-13 DIAGNOSIS — M9901 Segmental and somatic dysfunction of cervical region: Secondary | ICD-10-CM | POA: Diagnosis not present

## 2022-10-14 DIAGNOSIS — M25512 Pain in left shoulder: Secondary | ICD-10-CM | POA: Diagnosis not present

## 2022-10-29 ENCOUNTER — Encounter: Payer: Self-pay | Admitting: *Deleted

## 2022-11-26 DIAGNOSIS — M7912 Myalgia of auxiliary muscles, head and neck: Secondary | ICD-10-CM | POA: Diagnosis not present

## 2022-11-26 DIAGNOSIS — M9901 Segmental and somatic dysfunction of cervical region: Secondary | ICD-10-CM | POA: Diagnosis not present

## 2022-11-26 DIAGNOSIS — M25652 Stiffness of left hip, not elsewhere classified: Secondary | ICD-10-CM | POA: Diagnosis not present

## 2022-11-26 DIAGNOSIS — M25651 Stiffness of right hip, not elsewhere classified: Secondary | ICD-10-CM | POA: Diagnosis not present

## 2022-11-26 DIAGNOSIS — M545 Low back pain, unspecified: Secondary | ICD-10-CM | POA: Diagnosis not present

## 2022-11-26 DIAGNOSIS — M546 Pain in thoracic spine: Secondary | ICD-10-CM | POA: Diagnosis not present

## 2022-11-26 DIAGNOSIS — M9902 Segmental and somatic dysfunction of thoracic region: Secondary | ICD-10-CM | POA: Diagnosis not present

## 2022-11-26 DIAGNOSIS — M9903 Segmental and somatic dysfunction of lumbar region: Secondary | ICD-10-CM | POA: Diagnosis not present

## 2022-11-26 DIAGNOSIS — M9905 Segmental and somatic dysfunction of pelvic region: Secondary | ICD-10-CM | POA: Diagnosis not present

## 2022-11-26 DIAGNOSIS — M542 Cervicalgia: Secondary | ICD-10-CM | POA: Diagnosis not present

## 2023-03-30 DIAGNOSIS — M9901 Segmental and somatic dysfunction of cervical region: Secondary | ICD-10-CM | POA: Diagnosis not present

## 2023-03-30 DIAGNOSIS — M9903 Segmental and somatic dysfunction of lumbar region: Secondary | ICD-10-CM | POA: Diagnosis not present

## 2023-03-30 DIAGNOSIS — M9902 Segmental and somatic dysfunction of thoracic region: Secondary | ICD-10-CM | POA: Diagnosis not present

## 2023-03-30 DIAGNOSIS — M25651 Stiffness of right hip, not elsewhere classified: Secondary | ICD-10-CM | POA: Diagnosis not present

## 2023-03-30 DIAGNOSIS — M542 Cervicalgia: Secondary | ICD-10-CM | POA: Diagnosis not present

## 2023-03-30 DIAGNOSIS — M546 Pain in thoracic spine: Secondary | ICD-10-CM | POA: Diagnosis not present

## 2023-03-30 DIAGNOSIS — M25652 Stiffness of left hip, not elsewhere classified: Secondary | ICD-10-CM | POA: Diagnosis not present

## 2023-03-30 DIAGNOSIS — M7912 Myalgia of auxiliary muscles, head and neck: Secondary | ICD-10-CM | POA: Diagnosis not present

## 2023-03-30 DIAGNOSIS — M9905 Segmental and somatic dysfunction of pelvic region: Secondary | ICD-10-CM | POA: Diagnosis not present

## 2023-03-30 DIAGNOSIS — M545 Low back pain, unspecified: Secondary | ICD-10-CM | POA: Diagnosis not present

## 2023-04-08 ENCOUNTER — Other Ambulatory Visit (HOSPITAL_COMMUNITY): Payer: Self-pay

## 2023-04-08 MED ORDER — SULFAMETHOXAZOLE-TRIMETHOPRIM 800-160 MG PO TABS
1.0000 | ORAL_TABLET | Freq: Two times a day (BID) | ORAL | 0 refills | Status: AC
  Filled 2023-04-08: qty 28, 14d supply, fill #0

## 2023-04-08 MED ORDER — TADALAFIL 5 MG PO TABS
5.0000 mg | ORAL_TABLET | Freq: Every day | ORAL | 6 refills | Status: AC | PRN
  Filled 2023-04-08: qty 30, 30d supply, fill #0
  Filled 2023-07-12 – 2023-07-16 (×2): qty 30, 30d supply, fill #1
  Filled 2023-12-13 – 2023-12-28 (×3): qty 30, 30d supply, fill #2
  Filled 2024-02-10: qty 30, 30d supply, fill #3

## 2023-04-09 ENCOUNTER — Other Ambulatory Visit (HOSPITAL_COMMUNITY): Payer: Self-pay

## 2023-04-13 ENCOUNTER — Other Ambulatory Visit (HOSPITAL_COMMUNITY): Payer: Self-pay

## 2023-04-13 MED ORDER — METHOCARBAMOL 500 MG PO TABS
ORAL_TABLET | ORAL | 1 refills | Status: AC
  Filled 2023-04-13: qty 20, 7d supply, fill #0

## 2023-06-25 ENCOUNTER — Other Ambulatory Visit (HOSPITAL_COMMUNITY): Payer: Self-pay

## 2023-06-25 MED ORDER — DIAZEPAM 10 MG PO TABS
10.0000 mg | ORAL_TABLET | Freq: Every day | ORAL | 4 refills | Status: AC
  Filled 2023-06-25: qty 20, 20d supply, fill #0
  Filled 2023-07-12 – 2023-07-16 (×2): qty 20, 20d supply, fill #1
  Filled 2023-09-08: qty 20, 20d supply, fill #2
  Filled 2023-10-20: qty 20, 20d supply, fill #3

## 2023-07-16 ENCOUNTER — Other Ambulatory Visit (HOSPITAL_COMMUNITY): Payer: Self-pay

## 2023-07-23 ENCOUNTER — Other Ambulatory Visit (HOSPITAL_COMMUNITY): Payer: Self-pay

## 2023-09-08 ENCOUNTER — Other Ambulatory Visit: Payer: Self-pay

## 2023-09-15 ENCOUNTER — Other Ambulatory Visit (HOSPITAL_COMMUNITY): Payer: Self-pay

## 2023-10-08 ENCOUNTER — Other Ambulatory Visit (HOSPITAL_COMMUNITY): Payer: Self-pay

## 2023-10-08 MED ORDER — OXYCODONE-ACETAMINOPHEN 5-325 MG PO TABS
ORAL_TABLET | ORAL | 0 refills | Status: AC
  Filled 2023-10-08: qty 10, 2d supply, fill #0

## 2023-12-13 ENCOUNTER — Other Ambulatory Visit (HOSPITAL_COMMUNITY): Payer: Self-pay

## 2023-12-24 ENCOUNTER — Other Ambulatory Visit (HOSPITAL_COMMUNITY): Payer: Self-pay

## 2023-12-28 ENCOUNTER — Other Ambulatory Visit (HOSPITAL_COMMUNITY): Payer: Self-pay

## 2024-02-10 ENCOUNTER — Other Ambulatory Visit (HOSPITAL_COMMUNITY): Payer: Self-pay

## 2024-04-26 ENCOUNTER — Other Ambulatory Visit (HOSPITAL_COMMUNITY): Payer: Self-pay

## 2024-04-27 ENCOUNTER — Other Ambulatory Visit (HOSPITAL_COMMUNITY): Payer: Self-pay

## 2024-04-27 MED ORDER — ANASTROZOLE 1 MG PO TABS
1.0000 mg | ORAL_TABLET | Freq: Every day | ORAL | 11 refills | Status: AC
  Filled 2024-04-27: qty 30, 30d supply, fill #0
  Filled 2024-08-02 – 2024-08-22 (×2): qty 30, 30d supply, fill #1
  Filled 2024-12-20: qty 30, 30d supply, fill #2

## 2024-04-27 MED ORDER — CLOMIPHENE CITRATE 50 MG PO TABS
50.0000 mg | ORAL_TABLET | Freq: Every day | ORAL | 1 refills | Status: AC
Start: 2024-04-27 — End: ?
  Filled 2024-04-27: qty 30, 30d supply, fill #0
  Filled 2024-08-02 – 2024-08-22 (×3): qty 30, 30d supply, fill #1
  Filled 2024-12-19: qty 30, 30d supply, fill #2

## 2024-04-28 ENCOUNTER — Other Ambulatory Visit (HOSPITAL_COMMUNITY): Payer: Self-pay

## 2024-08-02 ENCOUNTER — Other Ambulatory Visit (HOSPITAL_COMMUNITY): Payer: Self-pay

## 2024-08-03 ENCOUNTER — Other Ambulatory Visit (HOSPITAL_COMMUNITY): Payer: Self-pay

## 2024-08-04 ENCOUNTER — Other Ambulatory Visit (HOSPITAL_COMMUNITY): Payer: Self-pay

## 2024-08-08 ENCOUNTER — Other Ambulatory Visit (HOSPITAL_COMMUNITY): Payer: Self-pay

## 2024-08-10 ENCOUNTER — Other Ambulatory Visit (HOSPITAL_COMMUNITY): Payer: Self-pay

## 2024-08-10 ENCOUNTER — Other Ambulatory Visit: Payer: Self-pay | Admitting: Family Medicine

## 2024-08-11 ENCOUNTER — Encounter (HOSPITAL_COMMUNITY): Payer: Self-pay

## 2024-08-11 ENCOUNTER — Other Ambulatory Visit (HOSPITAL_COMMUNITY): Payer: Self-pay

## 2024-08-14 ENCOUNTER — Other Ambulatory Visit (HOSPITAL_COMMUNITY): Payer: Self-pay

## 2024-08-22 ENCOUNTER — Other Ambulatory Visit (HOSPITAL_COMMUNITY): Payer: Self-pay

## 2024-08-24 ENCOUNTER — Other Ambulatory Visit (HOSPITAL_COMMUNITY): Payer: Self-pay

## 2024-09-04 ENCOUNTER — Other Ambulatory Visit (HOSPITAL_COMMUNITY): Payer: Self-pay

## 2024-09-04 MED ORDER — FLUZONE 0.5 ML IM SUSY
0.5000 mL | PREFILLED_SYRINGE | Freq: Once | INTRAMUSCULAR | 0 refills | Status: AC
  Filled 2024-09-04: qty 0.5, 1d supply, fill #0

## 2024-09-04 MED ORDER — TADALAFIL 5 MG PO TABS
5.0000 mg | ORAL_TABLET | Freq: Every day | ORAL | 11 refills | Status: AC
  Filled 2024-09-04 – 2024-09-20 (×2): qty 30, 30d supply, fill #0
  Filled 2024-12-20: qty 30, 30d supply, fill #1

## 2024-09-14 ENCOUNTER — Other Ambulatory Visit (HOSPITAL_COMMUNITY): Payer: Self-pay

## 2024-09-20 ENCOUNTER — Other Ambulatory Visit (HOSPITAL_COMMUNITY): Payer: Self-pay

## 2024-12-20 ENCOUNTER — Other Ambulatory Visit (HOSPITAL_COMMUNITY): Payer: Self-pay

## 2024-12-21 ENCOUNTER — Other Ambulatory Visit (HOSPITAL_COMMUNITY): Payer: Self-pay

## 2024-12-22 ENCOUNTER — Other Ambulatory Visit (HOSPITAL_COMMUNITY): Payer: Self-pay
